# Patient Record
Sex: Male | Born: 1995 | Hispanic: No | Marital: Single | Smoking: Former smoker
Health system: Southern US, Community
[De-identification: ages and names within clinical notes are randomized; demographics above are authoritative.]

## PROBLEM LIST (undated history)

## (undated) DIAGNOSIS — J302 Other seasonal allergic rhinitis: Secondary | ICD-10-CM

## (undated) HISTORY — DX: Other seasonal allergic rhinitis: J30.2

## (undated) HISTORY — PX: CHOLESTEATOMA EXCISION: SHX1345

---
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
## 2016-04-14 DIAGNOSIS — F4320 Adjustment disorder, unspecified: Secondary | ICD-10-CM | POA: Diagnosis not present

## 2016-04-19 DIAGNOSIS — F4320 Adjustment disorder, unspecified: Secondary | ICD-10-CM | POA: Diagnosis not present

## 2016-05-11 DIAGNOSIS — F4320 Adjustment disorder, unspecified: Secondary | ICD-10-CM | POA: Diagnosis not present

## 2016-06-14 DIAGNOSIS — R001 Bradycardia, unspecified: Secondary | ICD-10-CM | POA: Diagnosis not present

## 2016-06-15 ENCOUNTER — Telehealth: Payer: Self-pay

## 2016-06-15 NOTE — Telephone Encounter (Signed)
SENT NOTES TO SCHEDULING 

## 2016-06-20 ENCOUNTER — Telehealth: Payer: Self-pay | Admitting: Cardiology

## 2016-06-20 NOTE — Telephone Encounter (Signed)
Received records from [facility] for appointment on 06/29/16 with Dr Harding.  Records put with Dr Harding's schedule for 06/29/16. lp °

## 2016-06-29 ENCOUNTER — Encounter: Payer: Self-pay | Admitting: Cardiology

## 2016-06-29 ENCOUNTER — Ambulatory Visit (INDEPENDENT_AMBULATORY_CARE_PROVIDER_SITE_OTHER): Payer: BLUE CROSS/BLUE SHIELD | Admitting: Cardiology

## 2016-06-29 VITALS — BP 110/74 | HR 52 | Ht 67.0 in | Wt 165.0 lb

## 2016-06-29 DIAGNOSIS — S299XXS Unspecified injury of thorax, sequela: Secondary | ICD-10-CM

## 2016-06-29 DIAGNOSIS — R002 Palpitations: Secondary | ICD-10-CM | POA: Diagnosis not present

## 2016-06-29 DIAGNOSIS — S299XXA Unspecified injury of thorax, initial encounter: Secondary | ICD-10-CM | POA: Insufficient documentation

## 2016-06-29 DIAGNOSIS — R001 Bradycardia, unspecified: Secondary | ICD-10-CM
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
## 2016-06-29 NOTE — Patient Instructions (Signed)
Schedule both at [address] Your physician has requested that you have an echocardiogram. Echocardiography is a painless test that uses sound waves to create images of your heart. It provides your doctor with information about the size and shape of your heart and how well your heart's chambers and valves are working. This procedure takes approximately one hour. There are no restrictions for this procedure.   Your physician has recommended that you wear a holter monitor 48 hours. Holter monitors are medical devices that record the heart's electrical activity. Doctors most often use these monitors to diagnose arrhythmias. Arrhythmias are problems with the speed or rhythm of the heartbeat. The monitor is a small, portable device. You can wear one while you do your normal daily activities. This is usually used to diagnose what is causing palpitations/syncope (passing out).    Your physician recommends that you schedule a follow-up appointment in 1 month with Dr Herbie Baltimore.

## 2016-07-01 ENCOUNTER — Encounter: Payer: Self-pay | Admitting: Cardiology

## 2016-07-01 NOTE — Assessment & Plan Note (Signed)
He does have pretty significant bradycardia on EKG, but has noted his heart rate can get the 160 bpm range of activity. I think he just may be good shape. I do think however that his bradycardia baseline makes him more syncopal feeling PACs and PVCs.  Plan: 48 hour Holter monitor. If this monitor is not helpful, we may need to consider a GXT to determine his heart rate response to exercise.
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
## 2016-07-01 NOTE — Assessment & Plan Note (Signed)
Sounds like he's feeling PACs or PVCs that are probably more pronounced because he is bradycardic at baseline. Plan: 48 hour Holter monitor

## 2016-07-06 DIAGNOSIS — H1045 Other chronic allergic conjunctivitis: Secondary | ICD-10-CM | POA: Diagnosis not present

## 2016-07-06 DIAGNOSIS — J3089 Other allergic rhinitis: Secondary | ICD-10-CM | POA: Diagnosis not present

## 2016-07-06 DIAGNOSIS — J301 Allergic rhinitis due to pollen: Secondary | ICD-10-CM | POA: Diagnosis not present

## 2016-07-13 ENCOUNTER — Ambulatory Visit (INDEPENDENT_AMBULATORY_CARE_PROVIDER_SITE_OTHER): Payer: BLUE CROSS/BLUE SHIELD

## 2016-07-13 ENCOUNTER — Other Ambulatory Visit: Payer: Self-pay

## 2016-07-13 ENCOUNTER — Ambulatory Visit (HOSPITAL_COMMUNITY): Payer: BLUE CROSS/BLUE SHIELD | Attending: Cardiovascular Disease

## 2016-07-13 DIAGNOSIS — X58XXXS Exposure to other specified factors, sequela: Secondary | ICD-10-CM | POA: Diagnosis not present

## 2016-07-13 DIAGNOSIS — R002 Palpitations: Secondary | ICD-10-CM | POA: Diagnosis not present

## 2016-07-13 DIAGNOSIS — R001 Bradycardia, unspecified: Secondary | ICD-10-CM

## 2016-07-13 DIAGNOSIS — S299XXS Unspecified injury of thorax, sequela: Secondary | ICD-10-CM | POA: Diagnosis not present

## 2016-07-13 HISTORY — PX: TRANSTHORACIC ECHOCARDIOGRAM: SHX275

## 2016-07-15 DIAGNOSIS — R001 Bradycardia, unspecified: Secondary | ICD-10-CM | POA: Diagnosis not present

## 2016-07-15 DIAGNOSIS — R002 Palpitations: Secondary | ICD-10-CM

## 2016-07-15 DIAGNOSIS — S299XXS Unspecified injury of thorax, sequela: Secondary | ICD-10-CM | POA: Diagnosis not present

## 2016-07-29 ENCOUNTER — Ambulatory Visit (INDEPENDENT_AMBULATORY_CARE_PROVIDER_SITE_OTHER): Payer: BLUE CROSS/BLUE SHIELD | Admitting: Cardiology

## 2016-07-29 VITALS — BP 100/60 | HR 38 | Ht 67.0 in | Wt 165.2 lb

## 2016-07-29 DIAGNOSIS — R002 Palpitations: Secondary | ICD-10-CM | POA: Diagnosis not present

## 2016-07-29 DIAGNOSIS — S299XXS Unspecified injury of thorax, sequela: Secondary | ICD-10-CM

## 2016-07-29 DIAGNOSIS — R001 Bradycardia, unspecified: Secondary | ICD-10-CM

## 2016-07-29 NOTE — Patient Instructions (Signed)
NO OTHER CHANGES   BEFORE EXERCISING  WARM UP FIRST.     Your [facility] You will receive a reminder letter in the mail two months in advance. If you don't receive a letter, please call our office to schedule the follow-up appointment.
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
## 2016-07-30 ENCOUNTER — Encounter: Payer: Self-pay | Admitting: Cardiology

## 2016-07-30 NOTE — Assessment & Plan Note (Signed)
Thankfully, it appears by echocardiogram that has been no structural abnormalities with the heart. No signs of confusion with no regional wall motion abnormalities. No pericardial effusion.

## 2016-07-30 NOTE — Assessment & Plan Note (Signed)
As expected, PACs and PVCs more pronounced and bradycardia. He probably is symptomatic when he is feeling PVCs and trigeminy while during sinus bradycardia. The prolonged post ectopic pauses are probably symptomatic. I recommended that when he feels this way she get up walk around. He should not be concerned with drinking caffeine or any particular activity. Usually the symptoms will alleviate with exercise.

## 2016-07-30 NOTE — Assessment & Plan Note (Addendum)
He does have sinus bradycardia on EKG, but is relatively asymptomatic. He says he is able to get his heart rate up into the 160s with exercise, therefore he has no sign of chronotropic incompetence. His PCP apparently check TSH levels which would be the one thing that could potentially explain bradycardia other than just normal conditioning. At present, no acute indication for pacemaker placement. We'll continue to monitor reassess in a year. L think GXT is required since on the monitor he was able to get his heart rate above 110 without doing much activity.  I plan to discuss this etiology with electrophysiology just to ensure there is nothing new that needs to be done.  This does explain however his sense of getting a little fatigue going up hills or doing exercise. He needs to try to warm up well prior to exertional exercise.

## 2016-08-18 DIAGNOSIS — S20412A Abrasion of left back wall of thorax, initial encounter: Secondary | ICD-10-CM | POA: Diagnosis not present

## 2017-01-22 DIAGNOSIS — Z23 Encounter for immunization: Secondary | ICD-10-CM | POA: Diagnosis not present

## 2017-03-28 DIAGNOSIS — Z113 Encounter for screening for infections with a predominantly sexual mode of transmission: Secondary | ICD-10-CM | POA: Diagnosis not present

## 2017-03-28 DIAGNOSIS — Z114 Encounter for screening for human immunodeficiency virus [HIV]: Secondary | ICD-10-CM | POA: Diagnosis not present

## 2017-03-28 DIAGNOSIS — Z202 Contact with and (suspected) exposure to infections with a predominantly sexual mode of transmission: Secondary | ICD-10-CM | POA: Diagnosis not present

## 2017-05-19 DIAGNOSIS — Z113 Encounter for screening for infections with a predominantly sexual mode of transmission: Secondary | ICD-10-CM | POA: Diagnosis not present

## 2017-05-19 DIAGNOSIS — R369 Urethral discharge, unspecified: Secondary | ICD-10-CM | POA: Diagnosis not present

## 2017-07-28 DIAGNOSIS — J3089 Other allergic rhinitis: Secondary | ICD-10-CM | POA: Diagnosis not present

## 2017-07-28 DIAGNOSIS — H1045 Other chronic allergic conjunctivitis: Secondary | ICD-10-CM | POA: Diagnosis not present

## 2017-07-28 DIAGNOSIS — J301 Allergic rhinitis due to pollen: Secondary | ICD-10-CM | POA: Diagnosis not present

## 2018-02-09 DIAGNOSIS — H9012 Conductive hearing loss, unilateral, left ear, with unrestricted hearing on the contralateral side: Secondary | ICD-10-CM | POA: Diagnosis not present

## 2018-02-09 DIAGNOSIS — H9190 Unspecified hearing loss, unspecified ear: Secondary | ICD-10-CM | POA: Diagnosis not present

## 2018-02-09 DIAGNOSIS — H68023 Chronic Eustachian salpingitis, bilateral: Secondary | ICD-10-CM | POA: Diagnosis not present

## 2018-02-09 DIAGNOSIS — Z6827 Body mass index (BMI) 27.0-27.9, adult: Secondary | ICD-10-CM | POA: Diagnosis not present

## 2018-02-12 DIAGNOSIS — H95192 Other disorders following mastoidectomy, left ear: Secondary | ICD-10-CM | POA: Diagnosis not present

## 2018-02-12 DIAGNOSIS — H9012 Conductive hearing loss, unilateral, left ear, with unrestricted hearing on the contralateral side: Secondary | ICD-10-CM | POA: Diagnosis not present

## 2018-02-13 DIAGNOSIS — R030 Elevated blood-pressure reading, without diagnosis of hypertension: Secondary | ICD-10-CM | POA: Diagnosis not present

## 2018-07-26 ENCOUNTER — Telehealth: Payer: Self-pay | Admitting: Cardiology

## 2018-07-26 NOTE — Telephone Encounter (Signed)
smartphone/ my chart declined/ consent/ pre reg completed °

## 2018-07-30 ENCOUNTER — Telehealth: Payer: Self-pay | Admitting: *Deleted

## 2018-07-30 ENCOUNTER — Encounter: Payer: Self-pay | Admitting: Cardiology

## 2018-07-30 ENCOUNTER — Telehealth (INDEPENDENT_AMBULATORY_CARE_PROVIDER_SITE_OTHER): Payer: BLUE CROSS/BLUE SHIELD | Admitting: Cardiology

## 2018-07-30 VITALS — Ht 70.0 in | Wt 170.0 lb

## 2018-07-30 DIAGNOSIS — R002 Palpitations: Secondary | ICD-10-CM | POA: Diagnosis not present

## 2018-07-30 DIAGNOSIS — R001 Bradycardia, unspecified: Secondary | ICD-10-CM | POA: Diagnosis not present

## 2018-07-30 NOTE — Assessment & Plan Note (Signed)
I am sure he probably has PACs and PVCs there associated with his bradycardia, however the really fast heart rate spells that come on out of the blue are very likely related to either PAT or PSVT.  They seem to be pretty self-limiting and not associated with any dangerous symptoms of syncope or near syncope. I suspect that higher likelihood of PACs and PVCs with bradycardia would set him up for SVT if he does have an alternate pathway.  We discussed vagal maneuvers for breaking his rapid heart rate spells.  We also discussed relaxing techniques to avoid excess PACs and PVCs while trying to sleep

## 2018-07-30 NOTE — Assessment & Plan Note (Signed)
Pretty much asymptomatic bradycardia likely related to prolonged cardio exercises from childhood.  TSH levels are fine. The slower heart rate does set him up for having more premature beats that would be symptomatic, however treatment premature beats would only make his resting heart rate slow.  I suspect that this is on the he will likely grow out of, and do not think it is reasonable to suggest reduced cardio activity.

## 2018-07-30 NOTE — Progress Notes (Signed)
Virtual Visit via Video Note   This visit type was conducted due to national recommendations for restrictions regarding the COVID-19 Pandemic (e.g. social distancing) in an effort to limit this patient's exposure and mitigate transmission in our community.  Due to his co-morbid illnesses, this patient is at least at moderate risk for complications without adequate follow up.  This format is felt to be most appropriate for this patient at this time.  All issues noted in this document were discussed and addressed.  A limited physical exam was performed with this format.  Please refer to the patient's chart for his consent to telehealth [facility].   Patient has given verbal permission to conduct this visit via virtual appointment and to bill insurance 07/30/2018 2:40 PM     Evaluation Performed:  Follow-up visit  Date:  07/30/2018   ID:  Isaac Blissvan M Vecchiarelli, DOB 02/09/1996, MRN [id number]  Patient Location: Home Provider Location: Home  PCP:  Mila PalmerWolters, Sharon, MD  Cardiologist:   Bryan Lemmaavid Maddyx Vallie, MD  Electrophysiologist:  None   Chief Complaint:   2 yr f/u  History of Present Illness:    Isaac Blissvan M Allsup is a 23 y.o. male with PMH notable for h/o bradycardiao presents via audio/video conferencing for a telehealth visit today.  Isaac Blissvan M Janssens was last seen in June 2018 -- 48 hr monitor did show HR as low as 36 (asymptomatic), but no sign of Chronotropic Incompetence (able to get HR up to 160 bpm). No Sx. Likely vagal related bradycardia.  Interval History:  Clayburn Pertvan is doing fairly well.  He says that in 2018 to early 2019 he had about 3 or 4 episodes waking up in [place] with heart rates going up all of a sudden to the 160s associated with a little bit of chest discomfort and shortness of breath and more anxiety.  Usually what he feels though is that he is having a lot of skipped beats and irregular beats when he is trying to settle down at night.  It makes it hard for him to sleep he is anxious  about it.  He has been taking melatonin to help him sleep.  He says that they are bothersome but not debilitating. The fast heart rate spells may be last about a minute or 2 and then spontaneously resolved.  He has not had any syncope or near syncope associated with it.  No TIA or amaurosis fugax type symptoms.  Besides when he feels the really fast heart rate spells he is not noticing any chest discomfort or shortness of breath.  He still very active with exercising and no chest pain or pressure with rest or exertion. No PND, orthopnea or edema.  The patient does not have symptoms concerning for COVID-19 infection (fever, chills, cough, or new shortness of breath).  The patient is practicing social distancing.  ROS:  Please see the history of present illness.    Review of Systems  Constitutional: Negative for chills, fever, malaise/fatigue and weight loss.  HENT: Positive for congestion (A little bit of seasonal allergies).   Respiratory: Positive for cough (About a month ago he had some pretty bad allergy symptoms). Negative for shortness of breath.   Gastrointestinal: Negative for abdominal pain, blood in stool, heartburn and melena.  Genitourinary: Negative for hematuria.  Musculoskeletal: Negative for joint pain.  Neurological: Negative for dizziness and headaches.  Endo/Heme/Allergies: Positive for environmental allergies.  Psychiatric/Behavioral: The patient is nervous/anxious and has insomnia (See HPI).  Past Medical History:  Diagnosis Date   Seasonal allergies    Past Surgical History:  Procedure Laterality Date   CHOLESTEATOMA EXCISION     TRANSTHORACIC ECHOCARDIOGRAM  07/13/2016   Essentially normal study with normal LV size and function. EF 60-65%. No PFO noted. No pericardial effusion. No regional wall motion abnormalities to suggest contusion.     Current Meds  Medication Sig   Ascorbic Acid (VITAMIN C) 1000 MG tablet Take 1,000 mg by mouth daily.   B  Complex-C (B-COMPLEX WITH VITAMIN C) tablet Take 1 tablet by mouth daily.   levocetirizine (XYZAL) 5 MG tablet Take 5 mg by mouth every evening.   Multiple Vitamins-Minerals (MULTIVITAMIN MEN PO) Take 1 tablet by mouth daily.   omega-3 acid ethyl esters (LOVAZA) 1 g capsule Take 1 g by mouth daily.     Allergies:   Patient has no known allergies.   Social History   Tobacco Use   Smoking status: Former Smoker   Smokeless tobacco: Never Used  Substance Use Topics   Alcohol use: Yes    Alcohol/week: 2.0 standard drinks    Types: 2 Cans of beer per week    Comment: 1-2   Drug use: Yes    Comment: Smoked marijuana in the past     Family Hx: The patient's He was adopted. Family history is unknown by patient.   Prior CV studies:   The following studies were reviewed today:  None:  Labs/Other Tests and Data Reviewed:    EKG:  No ECG reviewed.  Recent Labs: No results found for requested labs within last 8760 hours.   Recent Lipid Panel No results found for: CHOL, TRIG, HDL, CHOLHDL, LDLCALC, LDLDIRECT  Wt Readings from Last 3 Encounters:  07/30/18 170 lb (77.1 kg)  07/29/16 165 lb 3.2 oz (74.9 kg)  06/29/16 165 lb (74.8 kg)     Objective:    Vital Signs:  Ht 5\' 10"  (1.778 m)    Wt 170 lb (77.1 kg)    BMI 24.39 kg/m   VITAL SIGNS:  reviewed GEN:  Well nourished, well developed male in no acute distress. EYES:  sclerae anicteric, EOMI - Extraocular Movements Intact RESPIRATORY:  normal respiratory effort, symmetric expansion CARDIOVASCULAR:  no peripheral edema MUSCULOSKELETAL:  no obvious deformities. NEURO:  alert and oriented x 3, no obvious focal deficit PSYCH:  normal affect   ASSESSMENT & PLAN:    Problem List Items Addressed This Visit    Intermittent palpitations (Chronic)    I am sure he probably has PACs and PVCs there associated with his bradycardia, however the really fast heart rate spells that come on out of the blue are very likely related  to either PAT or PSVT.  They seem to be pretty self-limiting and not associated with any dangerous symptoms of syncope or near syncope. I suspect that higher likelihood of PACs and PVCs with bradycardia would set him up for SVT if he does have an alternate pathway.  We discussed vagal maneuvers for breaking his rapid heart rate spells.  We also discussed relaxing techniques to avoid excess PACs and PVCs while trying to sleep      Sinus bradycardia by electrocardiogram - Primary (Chronic)    Pretty much asymptomatic bradycardia likely related to prolonged cardio exercises from childhood.  TSH levels are fine. The slower heart rate does set him up for having more premature beats that would be symptomatic, however treatment premature beats would only make his resting heart rate  slow.  I suspect that this is on the he will likely grow out of, and do not think it is reasonable to suggest reduced cardio activity.         COVID-19 Education: The signs and symptoms of COVID-19 were discussed with the patient and how to seek care for testing (follow up with PCP or arrange E-visit).   The importance of social distancing was discussed today.  Time:   Today, I have spent 20 minutes with the patient with telehealth technology discussing the above problems.  Majority of the time spent was reassuring him about his symptoms.   Medication Adjustments/Labs and Tests Ordered: Current medicines are reviewed at length with the patient today.  Concerns regarding medicines are outlined above.  Medication Instructions: No changes  - Vagal maneuvers for Rapid HR  Tests Ordered: No orders of the defined types were placed in this encounter.   Medication Changes: No orders of the defined types were placed in this encounter.   Disposition:  Follow up in 1 year(s)    Signed, Bryan Lemma, MD  07/30/2018 2:40 PM    [facility]

## 2018-07-30 NOTE — Telephone Encounter (Signed)
Spoke to patient tele-visit --instruction given for 07/30/18   avs summary will be mailed to patient - patient verbalized understanding.

## 2018-07-30 NOTE — Patient Instructions (Addendum)
Medication Instructions:  none  If you need a refill on your cardiac medications before your next appointment, please call your pharmacy.   Lab work: None     Testing/Procedures: No changes  Follow-Up: At [facility], you and your health needs are our priority.  As part of our continuing mission to provide you with exceptional heart care, we have created designated Provider Care Teams.  These Care Teams include your primary Cardiologist (physician) and Advanced Practice Providers (APPs -  Physician Assistants and Nurse Practitioners) who all work together to provide you with the care you need, when you need it. . You will need a follow up appointment in 12 months July 30, 2018.  Please call our office 2 months in advance to schedule this appointment.  You may see Bryan Lemma, MD or one of the following Advanced Practice Providers on your designated Care Team:   . Theodore Demark, PA-C . Joni Reining, DNP, ANP  Any Other Special Instructions Will Be Listed Below (If Applicable). --** Discussed Vagal maneuvers - cough, bearing down, squatting, etc. to treat fast HR spells.   Recommendations for vagal maneuvers:  "Bearing down"  Coughing  Gagging  Icy, cold towel on face or drink ice cold water  -- **relaxation, calming techniques to help with sleep

## 2018-08-03 DIAGNOSIS — J301 Allergic rhinitis due to pollen: Secondary | ICD-10-CM | POA: Diagnosis not present

## 2018-08-03 DIAGNOSIS — J3089 Other allergic rhinitis: Secondary | ICD-10-CM | POA: Diagnosis not present

## 2018-08-03 DIAGNOSIS — H1045 Other chronic allergic conjunctivitis: Secondary | ICD-10-CM | POA: Diagnosis not present

## 2018-09-04 DIAGNOSIS — Z23 Encounter for immunization: Secondary | ICD-10-CM | POA: Diagnosis not present

## 2018-09-05 DIAGNOSIS — Z23 Encounter for immunization: Secondary | ICD-10-CM | POA: Diagnosis not present

## 2019-02-25 DIAGNOSIS — M545 Low back pain: Secondary | ICD-10-CM | POA: Diagnosis not present

## 2019-09-03 ENCOUNTER — Other Ambulatory Visit: Payer: Self-pay

## 2019-09-03 ENCOUNTER — Ambulatory Visit (INDEPENDENT_AMBULATORY_CARE_PROVIDER_SITE_OTHER): Payer: BC Managed Care – PPO | Admitting: Cardiology

## 2019-09-03 ENCOUNTER — Encounter: Payer: Self-pay | Admitting: Cardiology

## 2019-09-03 VITALS — BP 140/82 | HR 51 | Ht 67.0 in | Wt 181.4 lb

## 2019-09-03 DIAGNOSIS — R001 Bradycardia, unspecified: Secondary | ICD-10-CM | POA: Diagnosis not present

## 2019-09-03 DIAGNOSIS — R002 Palpitations: Secondary | ICD-10-CM | POA: Diagnosis not present

## 2019-09-03 NOTE — Progress Notes (Signed)
Primary Care Provider: Mila Palmer, MD Cardiologist: No primary care provider on file. Electrophysiologist: None  Clinic Note: Chief Complaint  Patient presents with  . Follow-up    Initially seen for bradycardia and palpitations.  . Palpitations    2 short-lived episodes of fast heart rates occurring in the last month, both occurred while sleeping, and wake him up     HPI:    Dennis Wolfe is a 24 y.o. male with a PMH notable for baseline sinus bradycardia with intermittent PACs and PVCs who presents today for annual follow-up.  Dennis Wolfe was last seen on July 30, 2018 via telemedicine.  Recent Hospitalizations: NONE  Reviewed  CV studies:    The following studies were reviewed today: (if available, images/films reviewed: From Epic Chart or Care Everywhere) . NONE:   Interval History:   Dennis Wolfe here today fairly long cardiac standpoint.  Dennis Wolfe seems to be quite anxious and is very concerned about having minimal episodes of fast heart rate spells.  Dennis Wolfe says that Dennis Wolfe has had 2 episodes in the last month where Dennis Wolfe is felt that his heart rate goes up really fast into the 100-110 range for short bursts of about 10 to 15 seconds.  Dennis Wolfe feels it is, skipping sensation but does not last long.  Does not seem irregular.  Dennis Wolfe feels the pounding sensation but is not associated with any dyspnea or other discomfort.  Usually these occur when Dennis Wolfe is sleeping and can sometimes wake him up from sleep.  Dennis Wolfe does not notice any symptoms of lightheadedness, fatigue or lack of exercise tolerance.  Dennis Wolfe is able to get his heart rate up into the 130s to 140s with exercise, but does note that it takes a little while to get up that high.  Dennis Wolfe works out relatively routinely with him aerobic type activities as well as weights.  Dennis Wolfe works out in [employer] several days a week.  CV Review of Symptoms (Summary) Cardiovascular ROS: no chest pain or dyspnea on exertion positive for - irregular heartbeat,  palpitations, rapid heart rate and This is described above negative for - chest pain, edema, orthopnea, paroxysmal nocturnal dyspnea, shortness of breath or Syncope/near syncope, TIA/amaurosis fugax, claudication  The patient does not have symptoms concerning for COVID-19 infection (fever, chills, cough, or new shortness of breath).  The patient is practicing social distancing & Masking.    REVIEWED OF SYSTEMS   Review of Systems  Constitutional: Negative for malaise/fatigue and weight loss.  HENT: Negative for nosebleeds.   Respiratory: Negative for shortness of breath.   Gastrointestinal: Negative for abdominal pain, blood in stool, constipation, melena and vomiting.  Genitourinary: Negative for hematuria and urgency.  Musculoskeletal: Positive for joint pain and myalgias.       Has intermittent pains from strenuous exercise  Neurological: Negative for dizziness and focal weakness.  Psychiatric/Behavioral: Negative for memory loss. The patient is nervous/anxious. The patient does not have insomnia.   All other systems reviewed and are negative.  I have reviewed and (if needed) personally updated the patient's problem list, medications, allergies, past medical and surgical history, social and family history.   PAST MEDICAL HISTORY   Past Medical History:  Diagnosis Date  . Seasonal allergies     PAST SURGICAL HISTORY   Past Surgical History:  Procedure Laterality Date  . CHOLESTEATOMA EXCISION    . TRANSTHORACIC ECHOCARDIOGRAM  07/13/2016   Essentially normal study with normal LV size and function. EF  60-65%. No PFO noted. No pericardial effusion. No regional wall motion abnormalities to suggest contusion.    MEDICATIONS/ALLERGIES   Current Meds  Medication Sig  . Ascorbic Acid (VITAMIN C) 1000 MG tablet Take 1,000 mg by mouth daily.  . B Complex-C (B-COMPLEX WITH VITAMIN C) tablet Take 1 tablet by mouth daily.  Marland Kitchen levocetirizine (XYZAL) 5 MG tablet Take 5 mg by mouth  every evening.  . Multiple Vitamins-Minerals (MULTIVITAMIN MEN PO) Take 1 tablet by mouth daily.  Marland Kitchen omega-3 acid ethyl esters (LOVAZA) 1 g capsule Take 1 g by mouth daily.    No Known Allergies  SOCIAL HISTORY/FAMILY HISTORY   Reviewed in Epic:  Pertinent findings:  Social History   Social History Narrative   Dennis Wolfe currently lives with his girlfriend.   Quit smoking on 06/14/2016.   Dennis Wolfe works out at [employer] about 2 hours at a time 6 days a week. Dennis Wolfe also does a lot of activity at work.     OBJCTIVE -PE, EKG, labs   Wt Readings from Last 3 Encounters:  09/03/19 181 lb 6.4 oz (82.3 kg)  07/30/18 170 lb (77.1 kg)  07/29/16 165 lb 3.2 oz (74.9 kg)    Physical Exam: BP 140/82   Pulse (!) 51   Ht 5\' 7"  (1.702 m)   Wt 181 lb 6.4 oz (82.3 kg)   SpO2 97%   BMI 28.41 kg/m  Physical Exam Constitutional:      General: Dennis Wolfe is not in acute distress.    Appearance: Normal appearance. Dennis Wolfe is normal weight.     Comments: Healthy-appearing.  Well-groomed  HENT:     Head: Normocephalic and atraumatic.  Neck:     Vascular: No carotid bruit.  Cardiovascular:     Rate and Rhythm: Normal rate and regular rhythm.     Pulses: Normal pulses.     Heart sounds: Normal heart sounds. No murmur heard.  No friction rub. No gallop.   Pulmonary:     Effort: Pulmonary effort is normal. No respiratory distress.     Breath sounds: Normal breath sounds.  Musculoskeletal:        General: No swelling.     Cervical back: Normal range of motion.  Neurological:     General: No focal deficit present.     Mental Status: Dennis Wolfe is alert and oriented to person, place, and time.  Psychiatric:        Mood and Affect: Mood normal.        Behavior: Behavior normal.        Thought Content: Thought content normal.        Judgment: Judgment normal.     Adult ECG Report  Rate: 51 ;  Rhythm: sinus bradycardia and Normal axis, intervals and durations.;   Narrative Interpretation: Stable EKG.  Recent Labs: Not  checked since 2018 No results found for: CHOL, HDL, LDLCALC, LDLDIRECT, TRIG, CHOLHDL Lab Results  Component Value Date   CREATININE 1.19 09/04/2019   BUN 15 09/04/2019   NA 140 09/04/2019   K 4.4 09/04/2019   CL 104 09/04/2019   CO2 28 09/04/2019   No results found for: TSH  ASSESSMENT/PLAN    Problem List Items Addressed This Visit    Sinus bradycardia by electrocardiogram (Chronic)    Pretty much stable asymptomatic resting bradycardia with longstanding low heart rates.  Thankfully, Dennis Wolfe does not really necessarily notice a low resting heart rate.  May have elevated total sense of chronotropic incompetence in the early  stages, but is able to get heart rate up adequately with exercise.  Dennis Wolfe is on no medications for now to avoid prolonged bradycardia despite having intermittent fast heart rate spells. Would not put on any AV nodal agents      Intermittent palpitations - Primary (Chronic)    I do not think his episodes are longer to be true PSVT or PAT.  Dennis Wolfe may have short bursts of 5-6 beats since Dennis Wolfe does note his heart rate goes up.  However I do not believe these are prolonged episodes.  Most are self-limiting, but Dennis Wolfe is certainly worried about them.  Dennis Wolfe probably is significantly ectopic beats that could be in bigeminy or trigeminy or may be even couplets//triplets or short bursts.  Unfortunately with his resting bradycardia, we cannot treat with beta-blocker nor what I want to his with his age. We talked about different ways about how to monitor these potential arrhythmias with 70 smart phone applications for heart rhythm monitoring.  Able to continue different options.  Otherwise, the importance of adequate hydration and nutrition as well as sleep.  We also discussed vagal maneuvers for prolonged spells.      Relevant Orders   EKG 12-Lead (Completed)       COVID-19 Education: The signs and symptoms of COVID-19 were discussed with the patient and how to seek care for testing  (follow up with PCP or arrange E-visit).   The importance of social distancing and COVID-19 vaccination was discussed today.  I spent a total of with the patient. >  50% of the time was spent in direct patient consultation.  Additional time spent with chart review  / charting (studies, outside notes, etc): 6 Total Time: 24 min  Current medicines are reviewed at length with the patient today.  (+/- concerns) none  Notice: This dictation was prepared with Dragon dictation along with smaller phrase technology. Any transcriptional errors that result from this process are unintentional and may not be corrected upon review.  Patient Instructions / Medication Changes & Studies & Tests Ordered   Patient Instructions  Medication Instructions:  No changes *If you need a refill on your cardiac medications before your next appointment, please call your pharmacy*   Lab Work: Not needed   Testing/Procedures: Not needed   Follow-Up: At [facility], you and your health needs are our priority.  As part of our continuing mission to provide you with exceptional heart care, we have created designated Provider Care Teams.  These Care Teams include your primary Cardiologist (physician) and Advanced Practice Providers (APPs -  Physician Assistants and Nurse Practitioners) who all work together to provide you with the care you need, when you need it.  We recommend signing up for the patient portal called "MyChart".  Sign up information is provided on this After Visit Summary.  MyChart is used to connect with patients for Virtual Visits (Telemedicine).  Patients are able to view lab/test results, encounter notes, upcoming appointments, etc.  Non-urgent messages can be sent to your provider as well.   To learn more about what you can do with MyChart, go to [email or web address].    Your next appointment:   12 month(s)  The format for your next appointment:   In Person  Provider:   Bryan Lemma, MD   Other Instructions  Recommendations for vagal maneuvers if  Prolong fast heartrate   "Bearing down"  Coughing  Gagging  Icy, cold towel on face or drink ice cold water  Studies Ordered:   Orders Placed This Encounter  Procedures  . EKG 12-Lead     Glenetta Hew, M.D., M.S. Interventional Cardiologist   Pager # [phone] Phone # [phone] [address] [place], [place] [address]   Thank you for choosing Heartcare at [facility]!!

## 2019-09-03 NOTE — Patient Instructions (Signed)
Medication Instructions:  No changes *If you need a refill on your cardiac medications before your next appointment, please call your pharmacy*   Lab Work: Not needed   Testing/Procedures: Not needed   Follow-Up: At [facility], you and your health needs are our priority.  As part of our continuing mission to provide you with exceptional heart care, we have created designated Provider Care Teams.  These Care Teams include your primary Cardiologist (physician) and Advanced Practice Providers (APPs -  Physician Assistants and Nurse Practitioners) who all work together to provide you with the care you need, when you need it.  We recommend signing up for the patient portal called "MyChart".  Sign up information is provided on this After Visit Summary.  MyChart is used to connect with patients for Virtual Visits (Telemedicine).  Patients are able to view lab/test results, encounter notes, upcoming appointments, etc.  Non-urgent messages can be sent to your provider as well.   To learn more about what you can do with MyChart, go to [email or web address].    Your next appointment:   12 month(s)  The format for your next appointment:   In Person  Provider:   Bryan Lemma, MD   Other Instructions  Recommendations for vagal maneuvers if  Prolong fast heartrate   "Bearing down"  Coughing  Gagging  Icy, cold towel on face or drink ice cold water

## 2019-09-04 ENCOUNTER — Emergency Department (HOSPITAL_COMMUNITY): Payer: BC Managed Care – PPO

## 2019-09-04 ENCOUNTER — Emergency Department (HOSPITAL_COMMUNITY)
Admission: EM | Admit: 2019-09-04 | Discharge: 2019-09-05 | Disposition: A | Discharge status: Home / Self Care | Payer: BC Managed Care – PPO | Attending: Emergency Medicine | Admitting: Emergency Medicine

## 2019-09-04 ENCOUNTER — Encounter (HOSPITAL_COMMUNITY): Payer: Self-pay

## 2019-09-04 DIAGNOSIS — K219 Gastro-esophageal reflux disease without esophagitis: Secondary | ICD-10-CM | POA: Diagnosis not present

## 2019-09-04 DIAGNOSIS — Z87891 Personal history of nicotine dependence: Secondary | ICD-10-CM | POA: Insufficient documentation

## 2019-09-04 DIAGNOSIS — R0602 Shortness of breath: Secondary | ICD-10-CM | POA: Diagnosis not present

## 2019-09-04 DIAGNOSIS — R0789 Other chest pain: Secondary | ICD-10-CM | POA: Insufficient documentation

## 2019-09-04 DIAGNOSIS — Z79899 Other long term (current) drug therapy: Secondary | ICD-10-CM | POA: Insufficient documentation

## 2019-09-04 DIAGNOSIS — R079 Chest pain, unspecified: Secondary | ICD-10-CM | POA: Diagnosis not present

## 2019-09-04 DIAGNOSIS — F17200 Nicotine dependence, unspecified, uncomplicated: Secondary | ICD-10-CM | POA: Diagnosis not present

## 2019-09-04 LAB — CBC
HCT: 46.9 % (ref 39.0–52.0)
Hemoglobin: 15.6 g/dL (ref 13.0–17.0)
MCH: 28.7 pg (ref 26.0–34.0)
MCHC: 33.3 g/dL (ref 30.0–36.0)
MCV: 86.4 fL (ref 80.0–100.0)
Platelets: 281 10*3/uL (ref 150–400)
RBC: 5.43 MIL/uL (ref 4.22–5.81)
RDW: 12.7 % (ref 11.5–15.5)
WBC: 8.2 10*3/uL (ref 4.0–10.5)
nRBC: 0 % (ref 0.0–0.2)

## 2019-09-04 LAB — TROPONIN I (HIGH SENSITIVITY)
Troponin I (High Sensitivity): 3 ng/L (ref <18)
Troponin I (High Sensitivity): 3 ng/L (ref <18)

## 2019-09-04 LAB — BASIC METABOLIC PANEL
Anion gap: 8 (ref 5–15)
BUN: 15 mg/dL (ref 6–20)
CO2: 28 mmol/L (ref 22–32)
Calcium: 9.4 mg/dL (ref 8.9–10.3)
Chloride: 104 mmol/L (ref 98–111)
Creatinine, Ser: 1.19 mg/dL (ref 0.61–1.24)
GFR calc Af Amer: >60 mL/min (ref >60)
GFR calc non Af Amer: >60 mL/min (ref >60)
Glucose, Bld: 99 mg/dL (ref 70–99)
Potassium: 4.4 mmol/L (ref 3.5–5.1)
Sodium: 140 mmol/L (ref 135–145)

## 2019-09-04 NOTE — ED Triage Notes (Addendum)
Pt arrives POV for eval of L sided chest/rib pain. Pt was seen by cards yesterday and stated that "they cleared me for anything going on with my heart". However, pt did not mention he was having pain at the cardiologist office yesterday, no labs/CXR at that time Reports worse w/ deep breath, but not more tender w/ palpation.

## 2019-09-05 ENCOUNTER — Encounter: Payer: Self-pay | Admitting: Cardiology

## 2019-09-05 LAB — HEPATIC FUNCTION PANEL
ALT: 72 U/L — ABNORMAL HIGH (ref 0–44)
AST: 45 U/L — ABNORMAL HIGH (ref 15–41)
Albumin: 4.4 g/dL (ref 3.5–5.0)
Alkaline Phosphatase: 46 U/L (ref 38–126)
Bilirubin, Direct: 0.1 mg/dL (ref 0.0–0.2)
Indirect Bilirubin: 1.3 mg/dL — ABNORMAL HIGH (ref 0.3–0.9)
Total Bilirubin: 1.4 mg/dL — ABNORMAL HIGH (ref 0.3–1.2)
Total Protein: 7.1 g/dL (ref 6.5–8.1)

## 2019-09-05 LAB — LIPASE, BLOOD: Lipase: 21 U/L (ref 11–51)

## 2019-09-05 MED ORDER — OMEPRAZOLE 20 MG PO CPDR: 20.0000 mg | DELAYED_RELEASE_CAPSULE | Freq: Every day | ORAL | 0 refills | Status: AC

## 2019-09-05 MED ORDER — ALUM & MAG HYDROXIDE-SIMETH 200-200-20 MG/5ML PO SUSP
30.0000 mL | Freq: Once | ORAL | Status: AC
  Administered 2019-09-05: 30 mL via ORAL
  Filled 2019-09-05: qty 30

## 2019-09-05 MED ORDER — PANTOPRAZOLE SODIUM 40 MG PO TBEC
40.0000 mg | DELAYED_RELEASE_TABLET | Freq: Once | ORAL | Status: AC
  Administered 2019-09-05: 40 mg via ORAL
  Filled 2019-09-05: qty 1

## 2019-09-05 MED ORDER — LIDOCAINE VISCOUS HCL 2 % MT SOLN
15.0000 mL | Freq: Once | OROMUCOSAL | Status: AC
  Administered 2019-09-05: 15 mL via ORAL
  Filled 2019-09-05: qty 15

## 2019-09-05 NOTE — Assessment & Plan Note (Signed)
Pretty much stable asymptomatic resting bradycardia with longstanding low heart rates.  Thankfully, he does not really necessarily notice a low resting heart rate.  May have elevated total sense of chronotropic incompetence in the early stages, but is able to get heart rate up adequately with exercise.  He is on no medications for now to avoid prolonged bradycardia despite having intermittent fast heart rate spells. Would not put on any AV nodal agents

## 2019-09-05 NOTE — ED Notes (Signed)
RN called lab, will add on additional labs.

## 2019-09-05 NOTE — Discharge Instructions (Signed)
You were seen today for chest pain.  Given association with food, this may be related to some reflux.  Take omeprazole daily to see if this improves.

## 2019-09-05 NOTE — ED Provider Notes (Signed)
[facility] EMERGENCY DEPARTMENT Provider Note   CSN: [id number] Arrival date & time: 09/04/19  1518     History Chief Complaint  Patient presents with  . Chest Pain    Dennis Wolfe is a 24 y.o. male.  HPI     This is a 24 year old male with a history of bradycardia and palpitations who presents with chest discomfort.  Patient reports ongoing discomfort since Saturday.  It has not been constant.  He states it is worse with eating.  When his stomach is full he reports sharp pains in the mid and left lower chest and abdomen.  He now has noted some pain in his left shoulder.  No worsening with certain movements or palpation.  Denies injury.  Denies shortness of breath, cough, fevers.  Denies any recent lower extremity swelling, history of blood clots, prolonged travel.  Currently he rates his pain at 4 out of 10.  He has not taken anything for his pain.  He has never had pain like this before.  Of note he was seen by cardiologist yesterday for separate complaint related to episodes of palpitations.  He reports that he was cleared from a cardiology perspective.  At that time he did not discuss with the cardiologist his chest discomfort.  Denies daily alcohol use.  Does report some binge drinking on the weekends.  Past Medical History:  Diagnosis Date  . Seasonal allergies     Patient Active Problem List   Diagnosis Date Noted  . Chest trauma 06/29/2016  . Sinus bradycardia by electrocardiogram 06/29/2016  . Intermittent palpitations 06/29/2016    Past Surgical History:  Procedure Laterality Date  . CHOLESTEATOMA EXCISION    . TRANSTHORACIC ECHOCARDIOGRAM  07/13/2016   Essentially normal study with normal LV size and function. EF 60-65%. No PFO noted. No pericardial effusion. No regional wall motion abnormalities to suggest contusion.       Family History  Adopted: Yes  Family history unknown: Yes    Social History   Tobacco Use  . Smoking status:  Former Games developer  . Smokeless tobacco: Never Used  Substance Use Topics  . Alcohol use: Yes    Alcohol/week: 2.0 standard drinks    Types: 2 Cans of beer per week    Comment: 1-2  . Drug use: Yes    Comment: Smoked marijuana in the past    Home Medications Prior to Admission medications   Medication Sig Start Date End Date Taking? Authorizing Provider  Ascorbic Acid (VITAMIN C) 1000 MG tablet Take 1,000 mg by mouth daily.    [provider]  B Complex-C (B-COMPLEX WITH VITAMIN C) tablet Take 1 tablet by mouth daily.    [provider]  levocetirizine (XYZAL) 5 MG tablet Take 5 mg by mouth every evening.    [provider]  Multiple Vitamins-Minerals (MULTIVITAMIN MEN PO) Take 1 tablet by mouth daily.    [provider]  omega-3 acid ethyl esters (LOVAZA) 1 g capsule Take 1 g by mouth daily.    [provider]  omeprazole (PRILOSEC) 20 MG capsule Take 1 capsule (20 mg total) by mouth daily. 09/05/19   Sandra Tellefsen, Mayer Masker, MD    Allergies    Patient has no known allergies.  Review of Systems   Review of Systems  Constitutional: Negative for fever.  Respiratory: Negative for cough and shortness of breath.   Cardiovascular: Positive for chest pain. Negative for leg swelling.  Gastrointestinal: Negative for abdominal pain,  diarrhea, nausea and vomiting.  Genitourinary: Negative for dysuria.  All other systems reviewed and are negative.   Physical Exam Updated Vital Signs BP 132/68   Pulse (!) 51   Temp 98.1 F (36.7 C) (Oral)   Resp 15   SpO2 96%   Physical Exam  ED Results / Procedures / Treatments   Labs (all labs ordered are listed, but only abnormal results are displayed) Labs Reviewed  HEPATIC FUNCTION PANEL - Abnormal; Notable for the following components:      Result Value   AST 45 (*)    ALT 72 (*)    Total Bilirubin 1.4 (*)    Indirect Bilirubin 1.3 (*)    All other components within normal limits  BASIC METABOLIC  PANEL  CBC  LIPASE, BLOOD  TROPONIN I (HIGH SENSITIVITY)  TROPONIN I (HIGH SENSITIVITY)    EKG EKG Interpretation  Date/Time:  Wednesday September 04 2019 15:23:30 EDT Ventricular Rate:  47 PR Interval:  150 QRS Duration: 98 QT Interval:  454 QTC Calculation: 401 R Axis:   72 Text Interpretation: Sinus bradycardia Nonspecific ST and T wave abnormality Abnormal ECG Confirmed by Ross Marcus ([id number]) on 09/05/2019 12:09:16 AM   Radiology DG Chest 2 View  Result Date: 09/04/2019 CLINICAL DATA:  Chest pain. Additional provided: Left-sided chest pain, shortness of breath for 5 days. Former smoker. EXAM: CHEST - 2 VIEW COMPARISON:  No pertinent prior studies available for comparison. FINDINGS: Shallow inspiration radiograph. Heart size within normal limits. No appreciable airspace consolidation. No evidence of pulmonary edema. No evidence of pleural effusion or pneumothorax. No acute bony abnormality identified. IMPRESSION: No evidence of active cardiopulmonary disease. Electronically Signed   By: Jackey Loge DO   On: 09/04/2019 16:25    Procedures Procedures (including critical care time)  Medications Ordered in ED Medications  alum & mag hydroxide-simeth (MAALOX/MYLANTA) 200-200-20 MG/5ML suspension 30 mL (30 mLs Oral Given 09/05/19 0030)    And  lidocaine (XYLOCAINE) 2 % viscous mouth solution 15 mL (15 mLs Oral Given 09/05/19 0029)  pantoprazole (PROTONIX) EC tablet 40 mg (40 mg Oral Given 09/05/19 0029)    ED Course  I have reviewed the triage vital signs and the nursing notes.  Pertinent labs & imaging results that were available during my care of the patient were reviewed by me and considered in my medical decision making (see chart for details).    MDM Rules/Calculators/A&P                           Patient presents with chest discomfort.  Symptoms worsened with food intake.  Overall nontoxic and vital signs are reassuring.  EKG shows sinus bradycardia.  No evidence of  arrhythmia or ischemia.  Chest x-ray without evidence of pneumothorax or pneumonia.  Doubt PE and he is PE RC negative.  Given association with food, reflux, gastritis, pancreatitis all considerations.  Added LFTs and lipase.  Patient was given a GI cocktail and Protonix.  Lab work reviewed.  Very minimal elevation in LFTs but otherwise normal lipase.  On recheck, patient states he feels much better.  Will start on omeprazole daily.  Follow-up with PCP recommended  After history, exam, and medical workup I feel the patient has been appropriately medically screened and is safe for discharge home. Pertinent diagnoses were discussed with the patient. Patient was given return precautions.   Final Clinical Impression(s) / ED Diagnoses Final diagnoses:  Atypical chest pain  Gastroesophageal  reflux disease, unspecified whether esophagitis present    Rx / DC Orders ED Discharge Orders         Ordered    omeprazole (PRILOSEC) 20 MG capsule  Daily     Discontinue  Reprint     09/05/19 0139           Shon Baton, MD 09/05/19 [phone]

## 2019-09-05 NOTE — Assessment & Plan Note (Addendum)
I do not think his episodes are longer to be true PSVT or PAT.  He may have short bursts of 5-6 beats since he does note his heart rate goes up.  However I do not believe these are prolonged episodes.  Most are self-limiting, but he is certainly worried about them.  He probably is significantly ectopic beats that could be in bigeminy or trigeminy or may be even couplets//triplets or short bursts.  Unfortunately with his resting bradycardia, we cannot treat with beta-blocker nor what I want to his with his age. We talked about different ways about how to monitor these potential arrhythmias with 70 smart phone applications for heart rhythm monitoring.  Able to continue different options.  Otherwise, the importance of adequate hydration and nutrition as well as sleep.  We also discussed vagal maneuvers for prolonged spells.

## 2019-09-05 NOTE — ED Notes (Signed)
Pt was seen by cardio yesterday.  Reports left sided (under ribs)pain is worse after he eats thus has not been able to eat much lately.

## 2020-03-05 DIAGNOSIS — U071 COVID-19: Secondary | ICD-10-CM | POA: Diagnosis not present

## 2020-03-05 DIAGNOSIS — Z20822 Contact with and (suspected) exposure to covid-19: Secondary | ICD-10-CM | POA: Diagnosis not present

## 2021-02-02 DIAGNOSIS — R5383 Other fatigue: Secondary | ICD-10-CM | POA: Diagnosis not present

## 2021-02-02 DIAGNOSIS — Z23 Encounter for immunization: Secondary | ICD-10-CM | POA: Diagnosis not present

## 2021-02-02 DIAGNOSIS — J989 Respiratory disorder, unspecified: Secondary | ICD-10-CM | POA: Diagnosis not present

## 2021-02-02 DIAGNOSIS — Z13220 Encounter for screening for lipoid disorders: Secondary | ICD-10-CM | POA: Diagnosis not present

## 2021-02-04 DIAGNOSIS — Z13220 Encounter for screening for lipoid disorders: Secondary | ICD-10-CM | POA: Diagnosis not present

## 2021-02-04 DIAGNOSIS — R5383 Other fatigue: Secondary | ICD-10-CM | POA: Diagnosis not present

## 2021-03-23 DIAGNOSIS — H66001 Acute suppurative otitis media without spontaneous rupture of ear drum, right ear: Secondary | ICD-10-CM | POA: Diagnosis not present

## 2021-03-23 DIAGNOSIS — J029 Acute pharyngitis, unspecified: Secondary | ICD-10-CM | POA: Diagnosis not present
